# Patient Record
Sex: Female | Born: 1965 | Race: Black or African American | Hispanic: No | Marital: Married
Health system: Southern US, Community
[De-identification: ages and names within clinical notes are randomized; demographics above are authoritative.]

---

## 2004-06-06 ENCOUNTER — Ambulatory Visit: Payer: Self-pay

## 2009-11-25 ENCOUNTER — Ambulatory Visit: Payer: Self-pay | Admitting: Obstetrics and Gynecology

## 2011-01-03 ENCOUNTER — Ambulatory Visit: Payer: Self-pay | Admitting: Obstetrics and Gynecology

## 2012-01-04 ENCOUNTER — Ambulatory Visit: Payer: Self-pay | Admitting: Obstetrics and Gynecology

## 2013-01-06 ENCOUNTER — Ambulatory Visit: Payer: Self-pay | Admitting: Obstetrics and Gynecology

## 2014-01-07 ENCOUNTER — Ambulatory Visit: Payer: Self-pay | Admitting: Obstetrics and Gynecology

## 2014-01-24 ENCOUNTER — Emergency Department: Payer: Self-pay | Admitting: Emergency Medicine

## 2014-01-24 LAB — COMPREHENSIVE METABOLIC PANEL
ALT: 19 U/L
ANION GAP: 6 — AB (ref 7–16)
Albumin: 3.5 g/dL (ref 3.4–5.0)
Alkaline Phosphatase: 60 U/L
BUN: 15 mg/dL (ref 7–18)
Bilirubin,Total: 0.3 mg/dL (ref 0.2–1.0)
Calcium, Total: 8.7 mg/dL (ref 8.5–10.1)
Chloride: 106 mmol/L (ref 98–107)
Co2: 25 mmol/L (ref 21–32)
Creatinine: 1.12 mg/dL (ref 0.60–1.30)
EGFR (African American): >60
EGFR (Non-African Amer.): 56 — ABNORMAL LOW
GLUCOSE: 105 mg/dL — AB (ref 65–99)
Osmolality: 275 (ref 275–301)
Potassium: 3.6 mmol/L (ref 3.5–5.1)
SGOT(AST): 19 U/L (ref 15–37)
SODIUM: 137 mmol/L (ref 136–145)
Total Protein: 8.4 g/dL — ABNORMAL HIGH (ref 6.4–8.2)

## 2014-01-24 LAB — CBC
HCT: 42.1 % (ref 35.0–47.0)
HGB: 13.8 g/dL (ref 12.0–16.0)
MCH: 29.5 pg (ref 26.0–34.0)
MCHC: 32.8 g/dL (ref 32.0–36.0)
MCV: 90 fL (ref 80–100)
Platelet: 235 10*3/uL (ref 150–440)
RBC: 4.68 10*6/uL (ref 3.80–5.20)
RDW: 13.5 % (ref 11.5–14.5)
WBC: 6.6 10*3/uL (ref 3.6–11.0)

## 2014-01-27 ENCOUNTER — Ambulatory Visit: Payer: Self-pay | Admitting: Obstetrics and Gynecology

## 2014-12-01 ENCOUNTER — Other Ambulatory Visit: Payer: Self-pay | Admitting: Obstetrics and Gynecology

## 2014-12-01 DIAGNOSIS — N641 Fat necrosis of breast: Secondary | ICD-10-CM

## 2015-01-29 ENCOUNTER — Ambulatory Visit
Admission: RE | Admit: 2015-01-29 | Discharge: 2015-01-29 | Disposition: A | Discharge status: Home / Self Care | Payer: BLUE CROSS/BLUE SHIELD | Source: Ambulatory Visit | Attending: Obstetrics and Gynecology | Admitting: Obstetrics and Gynecology

## 2015-01-29 DIAGNOSIS — N641 Fat necrosis of breast: Secondary | ICD-10-CM

## 2015-12-08 ENCOUNTER — Other Ambulatory Visit: Payer: Self-pay | Admitting: Obstetrics and Gynecology

## 2015-12-08 DIAGNOSIS — Z1231 Encounter for screening mammogram for malignant neoplasm of breast: Secondary | ICD-10-CM

## 2016-01-14 ENCOUNTER — Ambulatory Visit
Admission: RE | Admit: 2016-01-14 | Discharge: 2016-01-14 | Disposition: A | Discharge status: Home / Self Care | Payer: BLUE CROSS/BLUE SHIELD | Source: Ambulatory Visit | Attending: Obstetrics and Gynecology | Admitting: Obstetrics and Gynecology

## 2016-01-14 DIAGNOSIS — Z1231 Encounter for screening mammogram for malignant neoplasm of breast: Secondary | ICD-10-CM

## 2016-02-08 ENCOUNTER — Ambulatory Visit
Admission: RE | Admit: 2016-02-08 | Discharge: 2016-02-08 | Disposition: A | Discharge status: Home / Self Care | Payer: BLUE CROSS/BLUE SHIELD | Source: Ambulatory Visit | Attending: Obstetrics and Gynecology | Admitting: Obstetrics and Gynecology

## 2016-02-08 DIAGNOSIS — Z1231 Encounter for screening mammogram for malignant neoplasm of breast: Secondary | ICD-10-CM | POA: Diagnosis present

## 2016-12-12 ENCOUNTER — Other Ambulatory Visit: Payer: Self-pay | Admitting: Obstetrics and Gynecology

## 2016-12-12 DIAGNOSIS — Z1231 Encounter for screening mammogram for malignant neoplasm of breast: Secondary | ICD-10-CM

## 2017-02-08 ENCOUNTER — Ambulatory Visit
Admission: RE | Admit: 2017-02-08 | Discharge: 2017-02-08 | Disposition: A | Discharge status: Home / Self Care | Payer: 59 | Source: Ambulatory Visit | Attending: Obstetrics and Gynecology | Admitting: Obstetrics and Gynecology

## 2017-02-08 DIAGNOSIS — Z1231 Encounter for screening mammogram for malignant neoplasm of breast: Secondary | ICD-10-CM | POA: Insufficient documentation

## 2017-12-18 ENCOUNTER — Other Ambulatory Visit: Payer: Self-pay | Admitting: Obstetrics and Gynecology

## 2017-12-18 DIAGNOSIS — Z1231 Encounter for screening mammogram for malignant neoplasm of breast: Secondary | ICD-10-CM

## 2018-02-11 ENCOUNTER — Ambulatory Visit
Admission: RE | Admit: 2018-02-11 | Discharge: 2018-02-11 | Disposition: A | Discharge status: Home / Self Care | Payer: PRIVATE HEALTH INSURANCE | Source: Ambulatory Visit | Attending: Obstetrics and Gynecology | Admitting: Obstetrics and Gynecology

## 2018-02-11 DIAGNOSIS — Z1231 Encounter for screening mammogram for malignant neoplasm of breast: Secondary | ICD-10-CM

## 2019-04-25 ENCOUNTER — Ambulatory Visit: Payer: PRIVATE HEALTH INSURANCE | Attending: Internal Medicine

## 2019-04-25 DIAGNOSIS — Z23 Encounter for immunization: Secondary | ICD-10-CM

## 2019-04-25 NOTE — Progress Notes (Signed)
   Covid-19 Vaccination Clinic  Name:  Robyn Cruz    MRN: [id number] DOB: May 08, 1965  04/25/2019  Robyn Cruz was observed post Covid-19 immunization for 15 minutes without incident. She was provided with Vaccine Information Sheet and instruction to access the V-Safe system.   Robyn Cruz was instructed to call 911 with any severe reactions post vaccine: Marland Kitchen Difficulty breathing  . Swelling of face and throat  . A fast heartbeat  . A bad rash all over body  . Dizziness and weakness   Immunizations Administered    Name Date Dose VIS Date Route   Pfizer COVID-19 Vaccine 04/25/2019  6:43 PM 0.3 mL 01/10/2019 Intramuscular   Manufacturer: [employer], [employer]   Lot: [id number]   NDC: [address]-2

## 2019-05-16 ENCOUNTER — Ambulatory Visit: Payer: PRIVATE HEALTH INSURANCE | Attending: Internal Medicine

## 2019-05-16 DIAGNOSIS — Z23 Encounter for immunization: Secondary | ICD-10-CM

## 2019-05-16 NOTE — Progress Notes (Signed)
   Covid-19 Vaccination Clinic  Name:  Robyn Cruz    MRN: [id number] DOB: [birth year]  05/16/2019  [name] was observed post Covid-19 immunization for 15 minutes without incident. She was provided with Vaccine Information Sheet and instruction to access the V-Safe system.   [name] was instructed to call 911 with any severe reactions post vaccine: Marland Kitchen Difficulty breathing  . Swelling of face and throat  . A fast heartbeat  . A bad rash all over body  . Dizziness and weakness   Immunizations Administered    Name Date Dose VIS Date Route   Pfizer COVID-19 Vaccine 05/16/2019  4:41 PM 0.3 mL 01/10/2019 Intramuscular   Manufacturer: [employer], [employer]   Lot: [id number]   NDC: [address]-0

## 2020-05-01 IMAGING — MG DIGITAL SCREENING BILATERAL MAMMOGRAM WITH TOMO AND CAD
8 series · 8 of 24 positions shown · non-contrast
Comparison: Previous exam(s).

CLINICAL DATA: Screening.

EXAM:
DIGITAL SCREENING BILATERAL MAMMOGRAM WITH TOMO AND CAD

[L CC synth-2D]
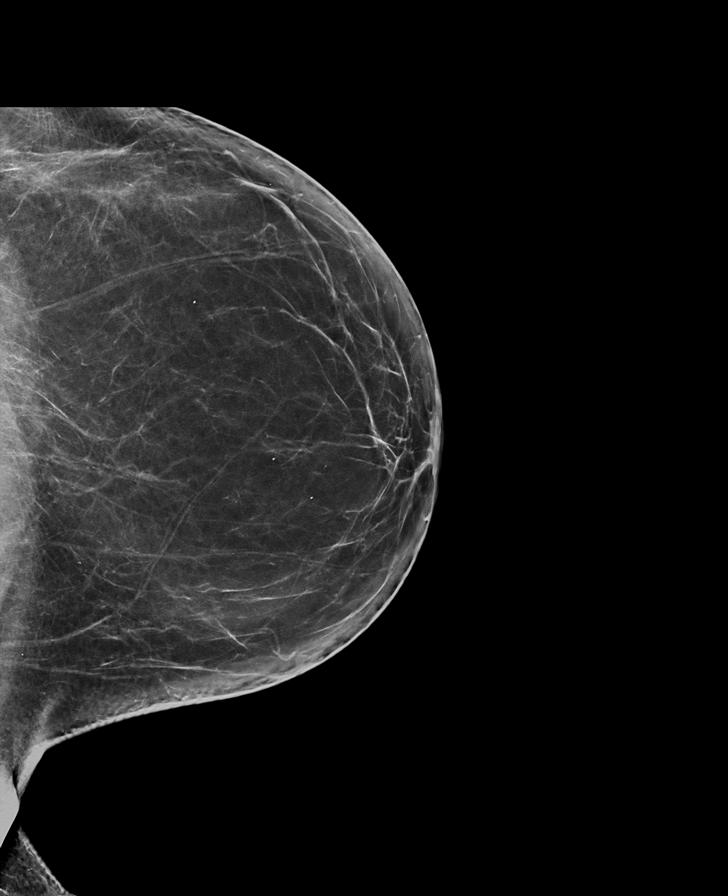

[R CC synth-2D]
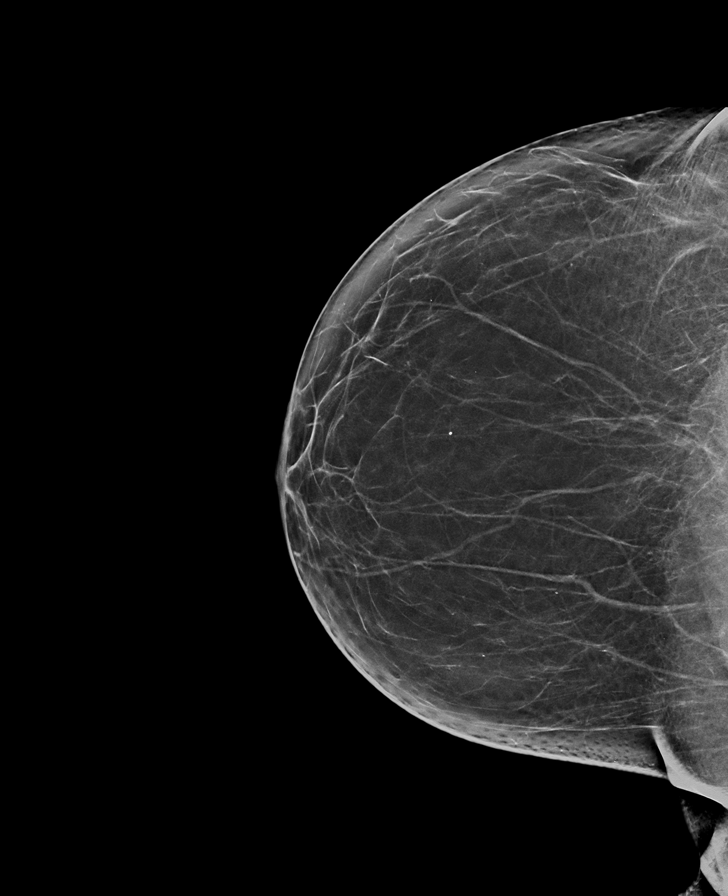

[L MLO synth-2D]
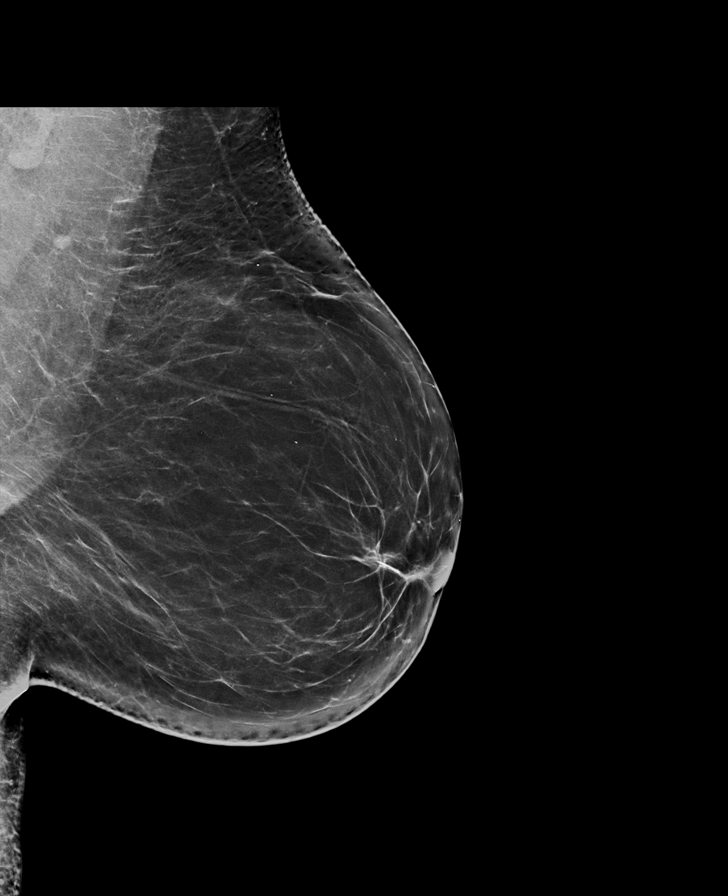

[R MLO synth-2D]
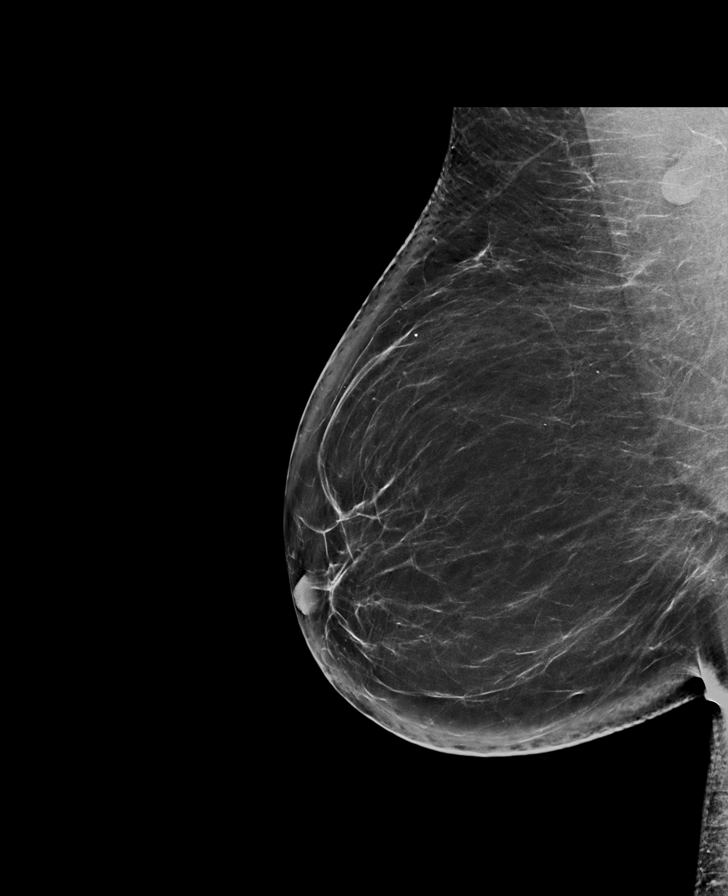

[L MLO tomo · tomo slice 43/86.0]
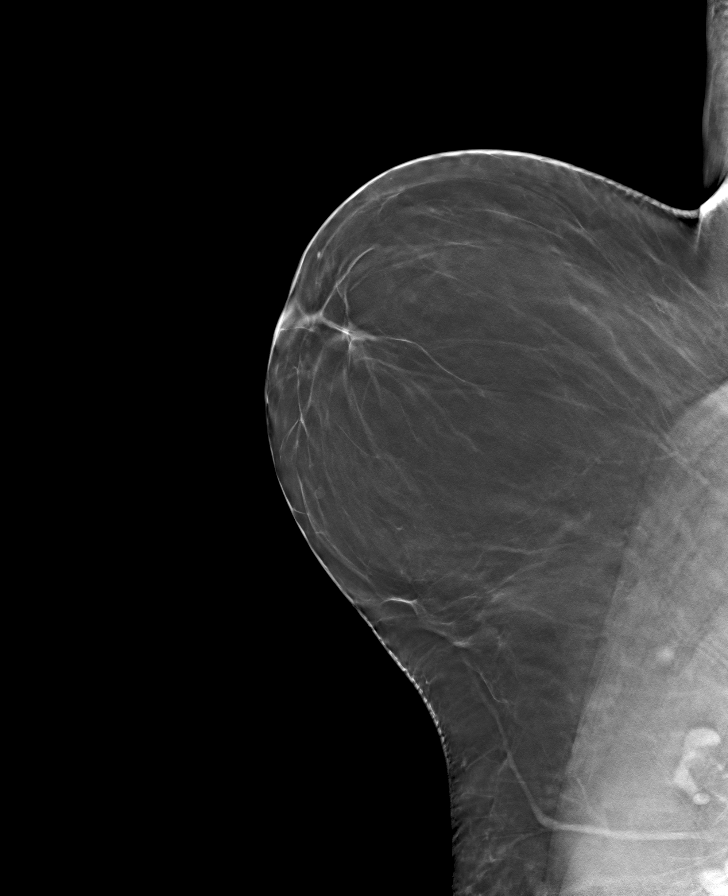

[R MLO tomo · tomo slice 43/86.0]
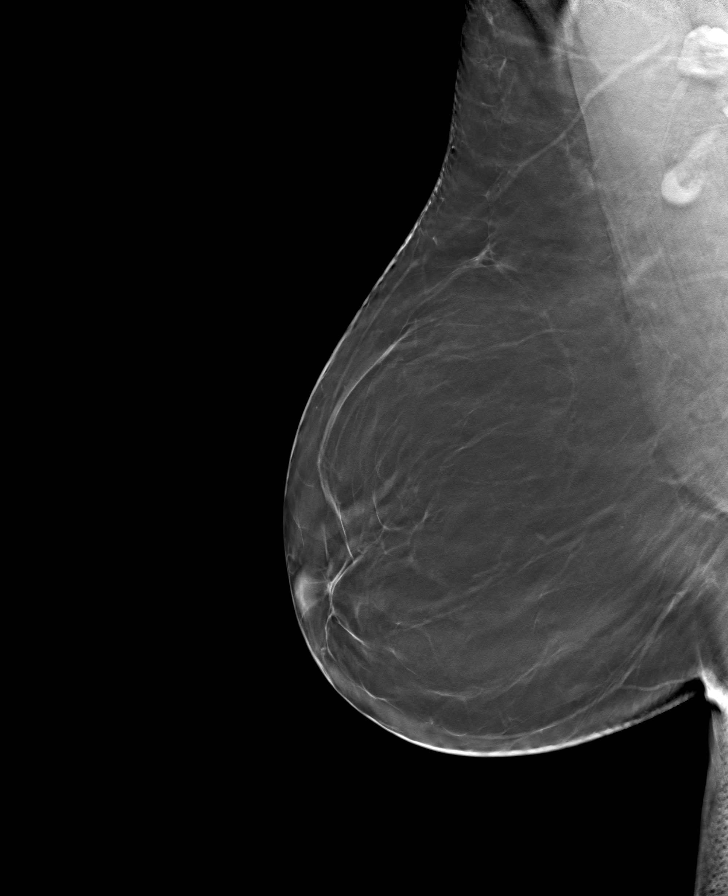

[R CC tomo · tomo slice 36/71.0]
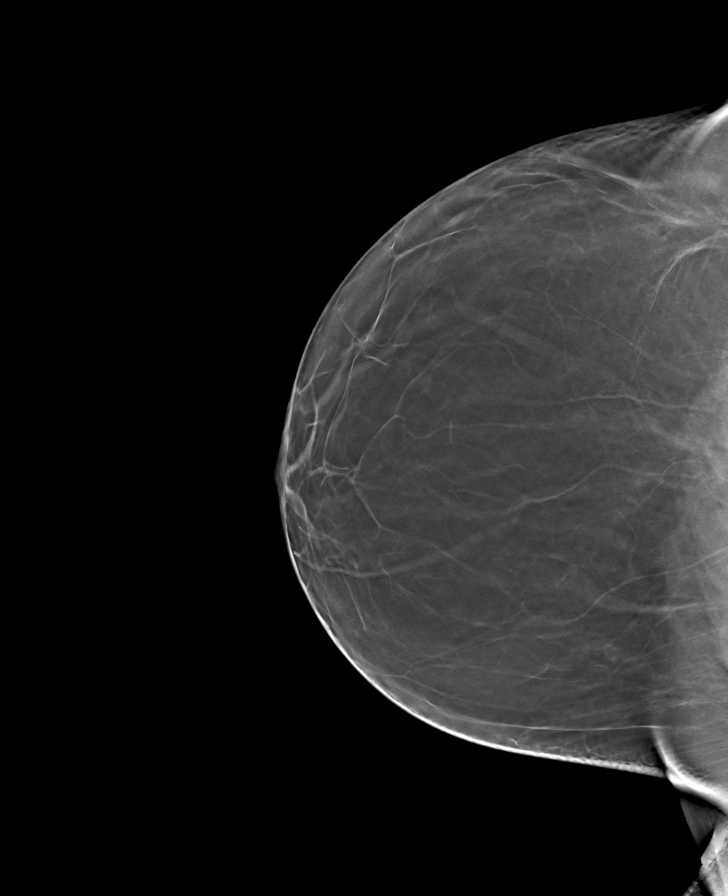

[L CC tomo · tomo slice 39/77.0]
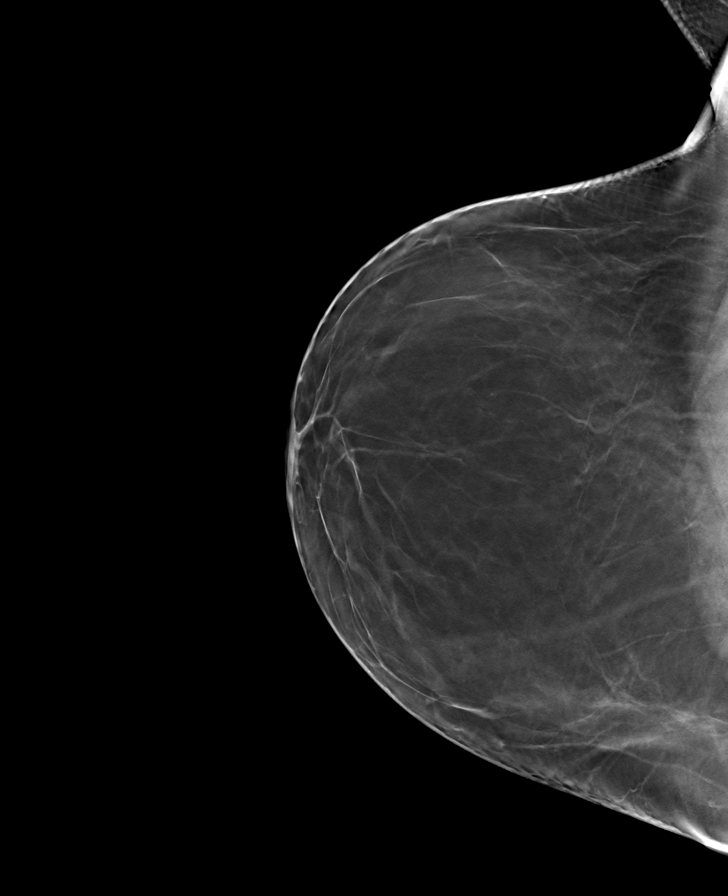

[8 of 24 positions shown; findings below may reference images not displayed]

ACR Breast Density Category b: There are scattered areas of
fibroglandular density.
FINDINGS: There are no findings suspicious for malignancy. Images were
processed with CAD.
IMPRESSION: No mammographic evidence of malignancy. A result letter of this
screening mammogram will be mailed directly to the patient.

RECOMMENDATION:
Screening mammogram in one year. (Code:[id number])

BI-RADS CATEGORY  1: Negative.
